# Patient Record
Sex: Female | Born: 1980 | Race: White | Hispanic: No | Marital: Married | Smoking: Never smoker
Health system: Southern US, Community
[De-identification: ages and names within clinical notes are randomized; demographics above are authoritative.]

## PROBLEM LIST (undated history)

## (undated) DIAGNOSIS — F32A Depression, unspecified: Secondary | ICD-10-CM

## (undated) DIAGNOSIS — F329 Major depressive disorder, single episode, unspecified: Secondary | ICD-10-CM

## (undated) DIAGNOSIS — Z8619 Personal history of other infectious and parasitic diseases: Secondary | ICD-10-CM

## (undated) HISTORY — PX: WISDOM TOOTH EXTRACTION: SHX21

## (undated) HISTORY — DX: Personal history of other infectious and parasitic diseases: Z86.19

## (undated) HISTORY — DX: Major depressive disorder, single episode, unspecified: F32.9

## (undated) HISTORY — PX: LAPAROSCOPY: SHX197

## (undated) HISTORY — DX: Depression, unspecified: F32.A

---

## 2005-03-30 ENCOUNTER — Ambulatory Visit: Payer: Self-pay | Admitting: Internal Medicine

## 2015-02-07 ENCOUNTER — Ambulatory Visit: Payer: BLUE CROSS/BLUE SHIELD | Admitting: Podiatry

## 2015-02-21 ENCOUNTER — Encounter: Payer: Self-pay | Admitting: Podiatry

## 2015-02-21 ENCOUNTER — Ambulatory Visit (INDEPENDENT_AMBULATORY_CARE_PROVIDER_SITE_OTHER): Payer: BLUE CROSS/BLUE SHIELD | Admitting: Podiatry

## 2015-02-21 VITALS — BP 115/72 | HR 58 | Resp 16 | Ht 68.0 in | Wt 150.0 lb

## 2015-02-21 DIAGNOSIS — M779 Enthesopathy, unspecified: Secondary | ICD-10-CM | POA: Diagnosis not present

## 2015-02-21 DIAGNOSIS — M2010 Hallux valgus (acquired), unspecified foot: Secondary | ICD-10-CM

## 2015-02-21 NOTE — Progress Notes (Signed)
   Subjective:    Patient ID: Cynthia Sweeney, female    DOB: 1980-09-07, 34 y.o.   MRN: [id number]  HPI Patient presents with needing new orthotics made. Pt's old ones are worn out.    Review of Systems  Neurological: Positive for headaches.  All other systems reviewed and are negative.      Objective:   Physical Exam        Assessment & Plan:

## 2015-02-24 NOTE — Progress Notes (Signed)
Subjective:     Patient ID: Cynthia Sweeney, female   DOB: [birth year], 34 y.o.   MRN: [id number]  HPI patient presents stating she needs new orthotics as her other ones have worn out. States that her feet are relatively flat and has the beginnings of structural bunion deformity   Review of Systems  All other systems reviewed and are negative.      Objective:   Physical Exam  Constitutional: She is oriented to person, place, and time.  Cardiovascular: Intact distal pulses.   Musculoskeletal: Normal range of motion.  Neurological: She is oriented to person, place, and time.  Skin: Skin is warm and dry.  Nursing note and vitals reviewed.  neurovascular status was found to be intact with muscle strength adequate range of motion within normal limits. 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 Scanned for custom orthotics to lift up the arch and take pressure off both feet

## 2015-03-21 ENCOUNTER — Ambulatory Visit: Payer: BLUE CROSS/BLUE SHIELD | Admitting: *Deleted

## 2015-03-21 DIAGNOSIS — M779 Enthesopathy, unspecified: Secondary | ICD-10-CM

## 2015-03-21 NOTE — Patient Instructions (Signed)

## 2015-03-21 NOTE — Progress Notes (Signed)
Patient ID: Cynthia Sweeney, female   DOB: 08/28/1980, 34 y.o.   MRN: [id number] Patient presents for orthotic pick up.  Verbal and written break in and wear instructions given.  Patient will follow up in 4 weeks if symptoms worsen or fail to improve.

## 2015-06-15 NOTE — L&D Delivery Note (Signed)
Delivery Note At 534pm a healthy female was delivered via spontaneous vaginal delivery  (Presentation:vertex ;  ROA).  APGAR:8 ,9 ; weight pending  .   Placenta status:delivered spontaneously .  Cord:  with the following complications:short.  Anesthesia:  epidural Episiotomy:  none Lacerations:  second degree Suture Repair: 2.0 vicryl to reinforce sphincter and 3-0 vicryl rapide Est. Blood Loss (mL):   Mom to postpartum.  Baby to Couplet care / Skin to Skin.  Oliver Pila 01/14/2016, 6:07 PM

## 2015-06-18 LAB — OB RESULTS CONSOLE ANTIBODY SCREEN: Antibody Screen: NEGATIVE

## 2015-06-18 LAB — OB RESULTS CONSOLE GC/CHLAMYDIA
CHLAMYDIA, DNA PROBE: NEGATIVE
Gonorrhea: NEGATIVE

## 2015-06-18 LAB — OB RESULTS CONSOLE RPR: RPR: NONREACTIVE

## 2015-06-18 LAB — OB RESULTS CONSOLE ABO/RH: RH Type: POSITIVE

## 2015-06-18 LAB — OB RESULTS CONSOLE RUBELLA ANTIBODY, IGM: RUBELLA: IMMUNE

## 2015-06-18 LAB — OB RESULTS CONSOLE HEPATITIS B SURFACE ANTIGEN: Hepatitis B Surface Ag: NEGATIVE

## 2015-06-18 LAB — OB RESULTS CONSOLE HIV ANTIBODY (ROUTINE TESTING): HIV: NONREACTIVE

## 2015-12-13 ENCOUNTER — Inpatient Hospital Stay (HOSPITAL_COMMUNITY): Admission: AD | Admit: 2015-12-13 | Payer: Self-pay | Source: Ambulatory Visit | Admitting: Obstetrics and Gynecology

## 2015-12-15 LAB — OB RESULTS CONSOLE GBS: GBS: NEGATIVE

## 2016-01-08 ENCOUNTER — Encounter (HOSPITAL_COMMUNITY): Payer: Self-pay | Admitting: *Deleted

## 2016-01-08 ENCOUNTER — Telehealth (HOSPITAL_COMMUNITY): Payer: Self-pay | Admitting: *Deleted

## 2016-01-08 NOTE — Telephone Encounter (Signed)
Preadmission screen  

## 2016-01-13 NOTE — H&P (Signed)
Cynthia Sweeney is a 35 y.o. female G1P0 at 54 5/7 weeks (EDD 01/09/16 by LMP c/w 10 week [place]) presenting for ripening and IOL post due date.   Prenatal care complicated by AMA, Panorama low risk, female.  She has a history of fibroids with largest one 6-8 cm and they have not really caused a problem this pregnancy.   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    OB History    Gravida Para Term Preterm AB Living   1             SAB TAB Ectopic Multiple Live Births                 Past Medical History:  Diagnosis Date  . Depression    mild  . Hx of varicella    Past Surgical History:  Procedure Laterality Date  . LAPAROSCOPY     ovarian cyst borderline serous, R tube removed  . WISDOM TOOTH EXTRACTION     Family History: family history is not on file. Social History:  reports that she has never smoked. She does not have any smokeless tobacco history on file. Her alcohol and drug histories are not on file.     Maternal Diabetes: No Genetic Screening: Normal Maternal Ultrasounds/Referrals: Normal  Fetal Ultrasounds or other Referrals:  None Maternal Substance Abuse:  No Significant Maternal Medications:  None Significant Maternal Lab Results:  None Other Comments:  None  Review of Systems  Gastrointestinal: Negative for abdominal pain.  Neurological: Negative for headaches.   Maternal Medical History:  Contractions: Frequency: rare.   Perceived severity is mild.    Fetal activity: Perceived fetal activity is normal.    Prenatal complications: Fibroids  Prenatal Complications - Diabetes: none.      Last menstrual period 04/04/2015. Maternal Exam:  Uterine Assessment: Contraction strength is mild.  Contraction frequency is rare.   Abdomen: Patient reports no abdominal tenderness. Surgical scars: low transverse.   Fetal presentation: vertex  Introitus: Normal vulva. Normal vagina.    Physical Exam  Constitutional: She appears  well-developed.  Cardiovascular: Normal rate and regular rhythm.   GI: Soft.  Genitourinary: Vagina normal.  Neurological: She is alert.  Psychiatric: She has a normal mood and affect.    Prenatal labs: ABO, Rh: O/Positive/-- (01/04 0000) Antibody: Negative (01/04 0000) Rubella: Immune (01/04 0000) RPR: Nonreactive (01/04 0000)  HBsAg: Negative (01/04 0000)  HIV: Non-reactive (01/04 0000)  GBS: Negative (07/03 0000)  One hour GCT 97  Assessment/Plan: Pt for IOL at term and plan is cervical ripening with cytotec then AROM and pitocin.     Oliver Pila 01/13/2016, 1:40 PM

## 2016-01-14 ENCOUNTER — Inpatient Hospital Stay (HOSPITAL_COMMUNITY): Payer: 59 | Admitting: Anesthesiology

## 2016-01-14 ENCOUNTER — Inpatient Hospital Stay (HOSPITAL_COMMUNITY)
Admission: RE | Admit: 2016-01-14 | Discharge: 2016-01-16 | DRG: 775 | Disposition: A | Discharge status: Home / Self Care | Payer: 59 | Source: Ambulatory Visit | Attending: Obstetrics and Gynecology | Admitting: Obstetrics and Gynecology

## 2016-01-14 ENCOUNTER — Encounter (HOSPITAL_COMMUNITY): Payer: Self-pay

## 2016-01-14 DIAGNOSIS — Z3A40 40 weeks gestation of pregnancy: Secondary | ICD-10-CM

## 2016-01-14 DIAGNOSIS — Z3490 Encounter for supervision of normal pregnancy, unspecified, unspecified trimester: Secondary | ICD-10-CM

## 2016-01-14 DIAGNOSIS — O480 Post-term pregnancy: Principal | ICD-10-CM | POA: Diagnosis present

## 2016-01-14 LAB — CBC
HCT: 37.2 % (ref 36.0–46.0)
Hemoglobin: 12.6 g/dL (ref 12.0–15.0)
MCH: 32.3 pg (ref 26.0–34.0)
MCHC: 33.9 g/dL (ref 30.0–36.0)
MCV: 95.4 fL (ref 78.0–100.0)
PLATELETS: 176 10*3/uL (ref 150–400)
RBC: 3.9 MIL/uL (ref 3.87–5.11)
RDW: 14.1 % (ref 11.5–15.5)
WBC: 9.8 10*3/uL (ref 4.0–10.5)

## 2016-01-14 LAB — ABO/RH: ABO/RH(D): O POS

## 2016-01-14 LAB — TYPE AND SCREEN
ABO/RH(D): O POS
Antibody Screen: NEGATIVE

## 2016-01-14 MED ORDER — WITCH HAZEL-GLYCERIN EX PADS: 1.0000 "application " | MEDICATED_PAD | CUTANEOUS | Status: DC | PRN

## 2016-01-14 MED ORDER — PHENYLEPHRINE 40 MCG/ML (10ML) SYRINGE FOR IV PUSH (FOR BLOOD PRESSURE SUPPORT)
80.0000 ug | PREFILLED_SYRINGE | INTRAVENOUS | Status: DC | PRN
  Filled 2016-01-14: qty 10
  Filled 2016-01-14: qty 5

## 2016-01-14 MED ORDER — ZOLPIDEM TARTRATE 5 MG PO TABS
5.0000 mg | ORAL_TABLET | Freq: Every evening | ORAL | Status: DC | PRN
  Administered 2016-01-14: 5 mg via ORAL
  Filled 2016-01-14: qty 1

## 2016-01-14 MED ORDER — OXYCODONE HCL 5 MG PO TABS: 5.0000 mg | ORAL_TABLET | ORAL | Status: DC | PRN

## 2016-01-14 MED ORDER — LACTATED RINGERS IV SOLN
INTRAVENOUS | Status: DC
  Administered 2016-01-14 (×2): via INTRAVENOUS

## 2016-01-14 MED ORDER — DIBUCAINE 1 % RE OINT: 1.0000 "application " | TOPICAL_OINTMENT | RECTAL | Status: DC | PRN

## 2016-01-14 MED ORDER — COCONUT OIL OIL
1.0000 "application " | TOPICAL_OIL | Status: DC | PRN
  Administered 2016-01-14: 1 via TOPICAL
  Filled 2016-01-14: qty 120

## 2016-01-14 MED ORDER — SOD CITRATE-CITRIC ACID 500-334 MG/5ML PO SOLN: 30.0000 mL | ORAL | Status: DC | PRN

## 2016-01-14 MED ORDER — LACTATED RINGERS IV SOLN
500.0000 mL | INTRAVENOUS | Status: DC | PRN
  Administered 2016-01-14: 250 mL via INTRAVENOUS

## 2016-01-14 MED ORDER — ONDANSETRON HCL 4 MG/2ML IJ SOLN: 4.0000 mg | INTRAMUSCULAR | Status: DC | PRN

## 2016-01-14 MED ORDER — FLEET ENEMA 7-19 GM/118ML RE ENEM: 1.0000 | ENEMA | RECTAL | Status: DC | PRN

## 2016-01-14 MED ORDER — ONDANSETRON HCL 4 MG PO TABS: 4.0000 mg | ORAL_TABLET | ORAL | Status: DC | PRN

## 2016-01-14 MED ORDER — TERBUTALINE SULFATE 1 MG/ML IJ SOLN
0.2500 mg | Freq: Once | INTRAMUSCULAR | Status: DC | PRN
  Filled 2016-01-14: qty 1

## 2016-01-14 MED ORDER — BENZOCAINE-MENTHOL 20-0.5 % EX AERO
1.0000 "application " | INHALATION_SPRAY | CUTANEOUS | Status: DC | PRN
  Administered 2016-01-14: 1 via TOPICAL
  Filled 2016-01-14: qty 56

## 2016-01-14 MED ORDER — IBUPROFEN 600 MG PO TABS
600.0000 mg | ORAL_TABLET | Freq: Four times a day (QID) | ORAL | Status: DC
  Administered 2016-01-15 – 2016-01-16 (×5): 600 mg via ORAL
  Filled 2016-01-14 (×4): qty 1

## 2016-01-14 MED ORDER — OXYCODONE-ACETAMINOPHEN 5-325 MG PO TABS: 2.0000 | ORAL_TABLET | ORAL | Status: DC | PRN

## 2016-01-14 MED ORDER — ACETAMINOPHEN 325 MG PO TABS: 650.0000 mg | ORAL_TABLET | ORAL | Status: DC | PRN

## 2016-01-14 MED ORDER — PRENATAL MULTIVITAMIN CH
1.0000 | ORAL_TABLET | Freq: Every day | ORAL | Status: DC
  Administered 2016-01-16: 1 via ORAL

## 2016-01-14 MED ORDER — LIDOCAINE HCL (PF) 1 % IJ SOLN
INTRAMUSCULAR | Status: DC | PRN
  Administered 2016-01-14 (×2): 4 mL

## 2016-01-14 MED ORDER — OXYTOCIN 40 UNITS IN LACTATED RINGERS INFUSION - SIMPLE MED: 2.5000 [IU]/h | INTRAVENOUS | Status: DC

## 2016-01-14 MED ORDER — ONDANSETRON HCL 4 MG/2ML IJ SOLN
4.0000 mg | Freq: Four times a day (QID) | INTRAMUSCULAR | Status: DC | PRN
  Administered 2016-01-14: 4 mg via INTRAVENOUS
  Filled 2016-01-14: qty 2

## 2016-01-14 MED ORDER — OXYCODONE-ACETAMINOPHEN 5-325 MG PO TABS
1.0000 | ORAL_TABLET | ORAL | Status: DC | PRN
Start: 2016-01-14 — End: 2016-01-14

## 2016-01-14 MED ORDER — EPHEDRINE 5 MG/ML INJ
10.0000 mg | INTRAVENOUS | Status: DC | PRN
  Filled 2016-01-14: qty 4

## 2016-01-14 MED ORDER — ACETAMINOPHEN 325 MG PO TABS
650.0000 mg | ORAL_TABLET | ORAL | Status: DC | PRN
  Administered 2016-01-16: 650 mg via ORAL
  Filled 2016-01-14: qty 2

## 2016-01-14 MED ORDER — LIDOCAINE HCL (PF) 1 % IJ SOLN
30.0000 mL | INTRAMUSCULAR | Status: DC | PRN
  Filled 2016-01-14: qty 30

## 2016-01-14 MED ORDER — LACTATED RINGERS IV SOLN
500.0000 mL | Freq: Once | INTRAVENOUS | Status: AC
  Administered 2016-01-14: 500 mL via INTRAVENOUS

## 2016-01-14 MED ORDER — SENNOSIDES-DOCUSATE SODIUM 8.6-50 MG PO TABS
2.0000 | ORAL_TABLET | ORAL | Status: DC
  Administered 2016-01-15 (×2): 2 via ORAL
  Filled 2016-01-14: qty 2

## 2016-01-14 MED ORDER — ZOLPIDEM TARTRATE 5 MG PO TABS: 5.0000 mg | ORAL_TABLET | Freq: Every evening | ORAL | Status: DC | PRN

## 2016-01-14 MED ORDER — MISOPROSTOL 25 MCG QUARTER TABLET
25.0000 ug | ORAL_TABLET | ORAL | Status: DC | PRN
  Administered 2016-01-14 (×2): 25 ug via VAGINAL
  Filled 2016-01-14: qty 1
  Filled 2016-01-14 (×2): qty 0.25

## 2016-01-14 MED ORDER — OXYTOCIN BOLUS FROM INFUSION
500.0000 mL | Freq: Once | INTRAVENOUS | Status: AC
  Administered 2016-01-14: 500 mL via INTRAVENOUS

## 2016-01-14 MED ORDER — DIPHENHYDRAMINE HCL 50 MG/ML IJ SOLN: 12.5000 mg | INTRAMUSCULAR | Status: DC | PRN

## 2016-01-14 MED ORDER — SIMETHICONE 80 MG PO CHEW
80.0000 mg | CHEWABLE_TABLET | ORAL | Status: DC | PRN
Start: 2016-01-14 — End: 2016-01-16

## 2016-01-14 MED ORDER — OXYCODONE HCL 5 MG PO TABS: 10.0000 mg | ORAL_TABLET | ORAL | Status: DC | PRN

## 2016-01-14 MED ORDER — PHENYLEPHRINE 40 MCG/ML (10ML) SYRINGE FOR IV PUSH (FOR BLOOD PRESSURE SUPPORT)
80.0000 ug | PREFILLED_SYRINGE | INTRAVENOUS | Status: DC | PRN
  Filled 2016-01-14: qty 5

## 2016-01-14 MED ORDER — DIPHENHYDRAMINE HCL 25 MG PO CAPS: 25.0000 mg | ORAL_CAPSULE | Freq: Four times a day (QID) | ORAL | Status: DC | PRN

## 2016-01-14 MED ORDER — OXYTOCIN 40 UNITS IN LACTATED RINGERS INFUSION - SIMPLE MED
1.0000 m[IU]/min | INTRAVENOUS | Status: DC
  Administered 2016-01-14: 2 m[IU]/min via INTRAVENOUS
  Filled 2016-01-14: qty 1000

## 2016-01-14 MED ORDER — FENTANYL 2.5 MCG/ML BUPIVACAINE 1/10 % EPIDURAL INFUSION (WH - ANES)
14.0000 mL/h | INTRAMUSCULAR | Status: DC | PRN
  Administered 2016-01-14 (×2): 14 mL/h via EPIDURAL
  Filled 2016-01-14: qty 125

## 2016-01-14 NOTE — Anesthesia Preprocedure Evaluation (Signed)
Anesthesia Evaluation  Patient identified by MRN, date of birth, ID band Patient awake    Reviewed: Allergy & Precautions, NPO status , Patient's Chart, lab work & pertinent test results  History of Anesthesia Complications Negative for: history of anesthetic complications  Airway Mallampati: II  TM Distance: >3 FB Neck ROM: Full    Dental no notable dental hx. (+) Dental Advisory Given   Pulmonary neg pulmonary ROS,    Pulmonary exam normal breath sounds clear to auscultation       Cardiovascular negative cardio ROS Normal cardiovascular exam Rhythm:Regular Rate:Normal     Neuro/Psych negative neurological ROS  negative psych ROS   GI/Hepatic negative GI ROS, Neg liver ROS,   Endo/Other  negative endocrine ROS  Renal/GU negative Renal ROS  negative genitourinary   Musculoskeletal negative musculoskeletal ROS (+)   Abdominal   Peds negative pediatric ROS (+)  Hematology negative hematology ROS (+)   Anesthesia Other Findings   Reproductive/Obstetrics (+) Pregnancy                             Anesthesia Physical Anesthesia Plan  ASA: II  Anesthesia Plan: Epidural   Post-op Pain Management:    Induction:   Airway Management Planned:   Additional Equipment:   Intra-op Plan:   Post-operative Plan:   Informed Consent: I have reviewed the patients History and Physical, chart, labs and discussed the procedure including the risks, benefits and alternatives for the proposed anesthesia with the patient or authorized representative who has indicated his/her understanding and acceptance.   Dental advisory given  Plan Discussed with: CRNA  Anesthesia Plan Comments:         Anesthesia Quick Evaluation  

## 2016-01-14 NOTE — Anesthesia Pain Management Evaluation Note (Signed)
  CRNA Pain Management Visit Note  Patient: Cynthia Sweeney, 35 y.o., female  "Hello I am a member of the anesthesia team at [facility]. We have an anesthesia team available at all times to provide care throughout the hospital, including epidural management and anesthesia for C-section. I don't know your plan for the delivery whether it a natural birth, water birth, IV sedation, nitrous supplementation, doula or epidural, but we want to meet your pain goals."   1.Was your pain managed to your expectations on prior hospitalizations?   Yes   2.What is your expectation for pain management during this hospitalization?     Epidural  3.How can we help you reach that goal? unsure  Record the patient's initial score and the patient's pain goal.   Pain: 1  Pain Goal: 7 The [facility] wants you to be able to say your pain was always managed very well.  Cephus Shelling 01/14/2016

## 2016-01-14 NOTE — Plan of Care (Signed)
Problem: Coping: Goal: Ability to cope will improve Outcome: Not Progressing Hx: depression  Problem: Urinary Elimination: Goal: Ability to reestablish a normal urinary elimination pattern will improve Outcome: Progressing Due to void

## 2016-01-14 NOTE — Anesthesia Procedure Notes (Addendum)
Epidural Patient location during procedure: OB  Staffing Anesthesiologist: Brenden Rudman, Rashawnda Performed: anesthesiologist   Preanesthetic Checklist Completed: patient identified, site marked, surgical consent, pre-op evaluation, timeout performed, IV checked, risks and benefits discussed and monitors and equipment checked  Epidural Patient position: sitting Prep: site prepped and draped and DuraPrep Patient monitoring: continuous pulse ox and blood pressure Approach: midline Location: L3-L4 Injection technique: LOR saline  Needle:  Needle type: Tuohy  Needle gauge: 17 G Needle length: 9 cm and 9 Needle insertion depth: 5 cm cm Catheter type: closed end flexible Catheter size: 19 Gauge Catheter at skin depth: 10 cm Test dose: negative  Assessment Events: blood not aspirated, injection not painful, no injection resistance, negative IV test and no paresthesia  Additional Notes Patient identified. Risks/Benefits/Options discussed with patient including but not limited to bleeding, infection, nerve damage, paralysis, failed block, incomplete pain control, headache, blood pressure changes, nausea, vomiting, reactions to medication both or allergic, itching and postpartum back pain. Confirmed with bedside nurse the patient's most recent platelet count. Confirmed with patient that they are not currently taking any anticoagulation, have any bleeding history or any family history of bleeding disorders. Patient expressed understanding and wished to proceed. All questions were answered. Sterile technique was used throughout the entire procedure. Please see nursing notes for vital signs. Test dose was given through epidural catheter and negative prior to continuing to dose epidural or start infusion. Warning signs of high block given to the patient including shortness of breath, tingling/numbness in hands, complete motor block, or any concerning symptoms with instructions to call for help. Patient was  given instructions on fall risk and not to get out of bed. All questions and concerns addressed with instructions to call with any issues or inadequate analgesia.        

## 2016-01-14 NOTE — Progress Notes (Signed)
Patient ID: Cynthia Sweeney, female   DOB: [birth year], 35 y.o.   MRN: [id number] Pt admitted and received 2 doses of cytotec overnight with mild cramping only  afeb vss FHR Category 1 Cervix 50/1-2/-2 midposition  AROM clear  Will start pitocin 4 hours after last cytotec.

## 2016-01-14 NOTE — Progress Notes (Signed)
Patient ID: Cynthia Sweeney, female   DOB: 09-Jan-1981, 35 y.o.   MRN: [id number] Pt getting increasingly uncomfortable, more back pain  afeb vss FHR Category 1  Cervix 75/2-3/-1  Getting uncomfortable, considering epidural

## 2016-01-15 LAB — CBC
HCT: 32.9 % — ABNORMAL LOW (ref 36.0–46.0)
Hemoglobin: 11 g/dL — ABNORMAL LOW (ref 12.0–15.0)
MCH: 31.5 pg (ref 26.0–34.0)
MCHC: 33.4 g/dL (ref 30.0–36.0)
MCV: 94.3 fL (ref 78.0–100.0)
PLATELETS: 152 10*3/uL (ref 150–400)
RBC: 3.49 MIL/uL — ABNORMAL LOW (ref 3.87–5.11)
RDW: 14.2 % (ref 11.5–15.5)
WBC: 17.8 10*3/uL — ABNORMAL HIGH (ref 4.0–10.5)

## 2016-01-15 LAB — RPR: RPR Ser Ql: NONREACTIVE

## 2016-01-15 NOTE — Progress Notes (Signed)
MOB was referred for history of depression/anxiety. * Referral screened out by Clinical Social Worker because none of the following criteria appear to apply: ~ History of anxiety/depression during this pregnancy, or of post-partum depression. ~ Diagnosis of anxiety and/or depression within last 3 years OR * MOB's symptoms currently being treated with medication and/or therapy. Please contact the Clinical Social Worker if needs arise, or if MOB requests.  CSW reviewed MOB's chart and no documentation of MH concerns during pregnancy.    Blaine Hamper, MSW, LCSW Clinical Social Work [phone]

## 2016-01-15 NOTE — Lactation Note (Signed)
This note was copied from a baby's chart. Lactation Consultation Note  Patient Name: Cynthia Sweeney [id number] Date: 01/15/2016 Reason for consult: Follow-up assessment   Follow up with mom to set up DEBP. Taught mom and dad how to set up, assemble, disassemble and clean pump parts. Advised mom to pump if not able to put infant to breast and can pump post BF if wants to encourage milk to come in. All EBM obtained from pumping/hand expressing should be fed to infant. Enc mom to call out for next feeding to be shown how to spoon feed or assist with BF. Mom reports nipples are burning/hurting with comfort gels, removed comfort gels and had mom place breast shells. Told mom she can use breast shells between feeds during the day and not to sleep in, mom voiced understanding. Mom to call out to desk for next feeding to be shown how to spoon feed or for feeding assistance. Follow up tomorrow and prn. Report and POC to [place], Charity fundraiser.    Maternal Data Formula Feeding for Exclusion: No Has patient been taught Hand Expression?: Yes Does the patient have breastfeeding experience prior to this delivery?: No  Feeding Feeding Type: Breast Fed Length of feed: 25 min  LATCH Score/Interventions Latch: Grasps breast easily, tongue down, lips flanged, rhythmical sucking. Intervention(s): Skin to skin;Teach feeding cues;Waking techniques Intervention(s): Adjust position;Assist with latch;Breast massage;Breast compression  Audible Swallowing: A few with stimulation Intervention(s): Hand expression;Skin to skin  Type of Nipple: Everted at rest and after stimulation  Comfort (Breast/Nipple): Engorged, cracked, bleeding, large blisters, severe discomfort Problem noted: Cracked, bleeding, blisters, bruises Intervention(s): Expressed breast milk to nipple  Interventions (Mild/moderate discomfort): Comfort gels;Hand expression  Hold (Positioning): Assistance needed to correctly position infant at breast and maintain  latch. Intervention(s): Breastfeeding basics reviewed;Support Pillows;Position options;Skin to skin  LATCH Score: 6  Lactation Tools Discussed/Used Tools: Pump;Shells Nipple shield size: 24 Shell Type: Inverted Breast pump type: Double-Electric Breast Pump WIC Program: No Pump Review: Setup, frequency, and cleaning;Milk Storage Initiated by:: Noralee Stain, RN IBCLC Date initiated:: 01/15/16   Consult Status Consult Status: Follow-up Date: 01/16/16 Follow-up type: In-patient    Cynthia Sweeney 01/15/2016, 3:45 PM

## 2016-01-15 NOTE — Progress Notes (Signed)
PPD #1 No problems Afeb, VSS Fundus firm, NT at U-1 Continue routine postpartum care 

## 2016-01-15 NOTE — Lactation Note (Signed)
This note was copied from a baby's chart. Lactation Consultation Note  Patient Name: Cynthia Sweeney Date: 01/15/2016 Reason for consult: Initial assessment;Breast/nipple pain   Initial consult with first time mom of 5 hour old infant. Infant with 4 BF for 20-25 minutes, 2 attempts, 2 voids and 4 stools since birth. LATCH score of 4 by bedside RN. Infant weight 8 lb 1.3 oz with 0% weight loss since birth.  Mom reports that it is very painful to nurse. Mom's nipples are excoriated and scant bleeding noted to breast pads. Mom reports she has been watched with feedings and it has been reported to her that the baby is latching well. She reports MD did not see a problem with baby's mouth on exam.  Infant was asleep in dad's arms, she aroused easily. Infant's mouth examined, she is noted to have a short labial frenulum that extends almost to the bottom of the upper gum, her upper lip flanges well. She has good tongue mobility and is noted to extend tongue freely. While sucking on gloved finger she is noted to extend tongue and cup finger. She is noted to have a semi-high palate.   Mom latched infant to left breast in football hold with good positioning and pillow support. Infant latched easily. She did need assistance with flanging upper and lower lips, showed mom and dad how to flange upper lips chin tug lower lip to widen gape and enc them to do so with each feeding. She was noted to have rhythmic sucking and swallowing. Mom reported severe pain/pinching with feeding. Showed mom how to break suction to unlatch infant. Nipple was noted to be round when infant unlatched.  Placed # 24 NS placed and infant re latched to left breast. Mom noted pain improved slightly with NS. Infant nursed for about 15 minutes and then infant was removed from breast. Small amount Colostrum was noted in NS. Mom fed infant colostrum on finger. Infant was then latched to right breast in cross cradle hold with # 24 NS. Mom  reports pain did not diminish in this position, nipple was compressed when infant taken off the breast. We then placed infant in football hold to right breast and again latched her to the breast where she fed for about 10 minutes and then fell asleep. Infant was removed from breast and colostrum was noted in NS, mom fed infant colostrum on finger. Infant was then held by dad and was in a deep sleep.   Mom was able to apply NS with feeding. Showed mom how to hand express and received 1 large gtt colostrum from each side. Mom was able to return demonstration. Enc mom to use Expressed colostrum to nipples post BF and then to apply Comfort Gels to nipples between feeds. Mom has been using coconut oil. Advised not to use coconut oil with Comfort Gels. Instructions for Comfort Gel use and cleaning reviewed. Discussed with parents that infant can be spoon fed colostrum once able to collect in spoon.   Discussed with mom pumping if she is not able to tolerate infant at the breast. Will return to set up DEBP once mom has lunch. Mom requesting assistance with next feeding.        Maternal Data Formula Feeding for Exclusion: No Has patient been taught Hand Expression?: Yes Does the patient have breastfeeding experience prior to this delivery?: No  Feeding Feeding Type: Breast Fed Length of feed: 25 min  LATCH Score/Interventions Latch: Grasps breast easily, tongue down,  lips flanged, rhythmical sucking. Intervention(s): Skin to skin;Teach feeding cues;Waking techniques Intervention(s): Adjust position;Assist with latch;Breast massage;Breast compression  Audible Swallowing: A few with stimulation Intervention(s): Hand expression;Skin to skin  Type of Nipple: Everted at rest and after stimulation  Comfort (Breast/Nipple): Engorged, cracked, bleeding, large blisters, severe discomfort Problem noted: Cracked, bleeding, blisters, bruises Intervention(s): Expressed breast milk to  nipple  Interventions (Mild/moderate discomfort): Comfort gels;Hand expression  Hold (Positioning): Assistance needed to correctly position infant at breast and maintain latch. Intervention(s): Breastfeeding basics reviewed;Support Pillows;Position options;Skin to skin  LATCH Score: 6  Lactation Tools Discussed/Used Tools: Nipple Shields;Comfort gels Nipple shield size: 24 WIC Program: No   Consult Status Consult Status: Follow-up Date: 01/15/16 Follow-up type: In-patient    Silas Flood Bana Borgmeyer 01/15/2016, 2:33 PM

## 2016-01-15 NOTE — Lactation Note (Signed)
This note was copied from a baby's chart. Lactation Consultation Note  Patient Name: Cynthia Sweeney [id number] Date: 01/15/2016 Reason for consult: Follow-up assessment;Breast/nipple pain  Mom called out for feeding assessment.  She just finished feeding on left breast and states it still pinched some but much better with nipple shield.  Nipple rounded after feeding.  Assisted with football hold on right side. Baby latched easily and latch appears good.  Observed active nursing with intermittent swallows.  Mom knows she can rest her nipples and pump in place if needed.  Wearing shells for comfort between feedings.  Encouraged to call for assist/concerns prn.   Maternal Data    Feeding Feeding Type: Breast Fed  LATCH Score/Interventions Latch: Grasps breast easily, tongue down, lips flanged, rhythmical sucking. Intervention(s): Skin to skin;Teach feeding cues;Waking techniques Intervention(s): Adjust position;Assist with latch;Breast massage;Breast compression  Audible Swallowing: A few with stimulation  Type of Nipple: Everted at rest and after stimulation  Comfort (Breast/Nipple): Filling, red/small blisters or bruises, mild/mod discomfort Problem noted: Cracked, bleeding, blisters, bruises Intervention(s): Expressed breast milk to nipple     Hold (Positioning): Assistance needed to correctly position infant at breast and maintain latch. Intervention(s): Breastfeeding basics reviewed;Support Pillows;Position options;Skin to skin  LATCH Score: 7  Lactation Tools Discussed/Used Tools: Nipple Shields Nipple shield size: 24 Shell Type: Inverted Breast pump type: Double-Electric Breast Pump Pump Review: Setup, frequency, and cleaning;Milk Storage Initiated by:: Noralee Stain, RN IBCLC Date initiated:: 01/15/16   Consult Status Consult Status: Follow-up Date: 01/16/16 Follow-up type: In-patient    Huston Foley 01/15/2016, 6:35 PM

## 2016-01-15 NOTE — Anesthesia Postprocedure Evaluation (Signed)
Anesthesia Post Note  Patient: Cynthia Sweeney  Procedure(s) Performed: * No procedures listed *  Patient location during evaluation: Mother Baby Anesthesia Type: Epidural Level of consciousness: awake Pain management: satisfactory to patient Vital Signs Assessment: post-procedure vital signs reviewed and stable Respiratory status: spontaneous breathing Cardiovascular status: stable Anesthetic complications: no     Last Vitals:  Vitals:   01/14/16 2100 01/15/16 0635  BP: 101/72 106/68  Pulse: 62 (!) 57  Resp: 18 18  Temp: 36.7 C 37.1 C    Last Pain:  Vitals:   01/15/16 0641  TempSrc:   PainSc: Asleep   Pain Goal: Patients Stated Pain Goal: 0 (01/14/16 2015)               Cephus Shelling

## 2016-01-16 MED ORDER — IBUPROFEN 600 MG PO TABS: 600.0000 mg | ORAL_TABLET | Freq: Four times a day (QID) | ORAL | 1 refills | Status: DC | PRN

## 2016-01-16 NOTE — Progress Notes (Signed)
Patient ID: Cynthia Sweeney, female   DOB: 1980/07/09, 35 y.o.   MRN: [id number] Pt doing well. Bonding with baby  - having some trouble with breastfeeding - sore nipples. Has tried shields; pumping now. Lochia moderate. No fever or chills. Ambulating and tolerating diet. Ready for discharge to home today VSS ABD- FF, below umb EXT - no Homans  A/P: PPD #2 s/p svd         Routine pp care         D/C instructions reviewed         Rx for ibuprofen         F/u in 6weeks

## 2016-01-16 NOTE — Lactation Note (Signed)
This note was copied from a baby's chart. Lactation Consultation Note  Patient Name: Cynthia Sweeney [id number]'T Date: 01/16/2016  Follow up visit made.  Mom states they had a rough night of cluster feeding and baby fussiness.  Baby was awake for several hours and now too sleepy to feed.  Mom is hand expressing one drop.  Assisted with symphony pump.  Mom will pump x15 minutes and watch baby for feeding cues.  Mom pumped 5 mls and I demonstrated finger feeding with curved tip syringe.  Baby took expressed milk and content after.  Mom is able to tolerate feedings when using a 24 mm nipple shield.  Nipples continue to look red and cracked but no worse than yesterday.  Mom using expressed breast milk to nipples after feeds/shells.  She did not like the comfort gels.  Instructed mom to feed with any feeding cue and to post pump if baby doesn't feed well plus 4 times in 24 hours to protect milk supply.  Reviewed engorgement treatment.  Outpatient lactation appointment scheduled for 01/21/16 1030.  To call if concerns prior to appointment.   Maternal Data    Feeding    LATCH Score/Interventions                      Lactation Tools Discussed/Used     Consult Status      Cynthia Sweeney 01/16/2016, 9:47 AM

## 2016-01-16 NOTE — Discharge Instructions (Signed)
Nothing in vagina for 6 weeks.  No sex, tampons, and douching.  Other instructions as in [facility] Discharge Booklet. °

## 2016-01-17 ENCOUNTER — Telehealth (HOSPITAL_COMMUNITY): Payer: Self-pay | Admitting: Lactation Services

## 2016-01-17 NOTE — Telephone Encounter (Signed)
Mom called, c/o engorgement. Engorgement advice given (cold; IB; cabbage leaves TID x 20 min, etc.). Mom is using a nipple shield & does see milk in the nipple shield when infant releases latch. Mom to call if no improvement. Glenetta Hew, RN, IBCLC

## 2016-01-21 ENCOUNTER — Ambulatory Visit (HOSPITAL_COMMUNITY)
Admission: RE | Admit: 2016-01-21 | Discharge: 2016-01-21 | Disposition: A | Discharge status: Home / Self Care | Payer: 59 | Source: Ambulatory Visit | Attending: Obstetrics and Gynecology | Admitting: Obstetrics and Gynecology

## 2016-01-21 NOTE — Lactation Note (Signed)
Lactation Consult  Mother's reason for visit:  Sore nipples, concerns about breastfeeding Visit Type:  Outpatient Appointment Notes:  Baby has not stooled in almost 24 hours. Approx 5 voids in 24 hours.  Mother has been able to pump approx 25 ml with DEBP.  Mother has been using #24NS due to sore nipples.  Mother has been engorged for approx 3 days which is now resolved. Mother has only been pumping once a day or less sometimes.  Discussed the importance of pumping if using the NS since it is a barrier.  Suggested latching without NS.  Baby latched on both sides without NS easily with minimal discomfort. Mother finds the cluster feeding very exhausting and has started supplementing w/ formula during the night.  Provided strategies for helping with cluster feeding - suggest side lying and giving pumped breastmilk at night to baby after breastfeeding.  Recommend she rotate positions for breastfeeding.  Did note tight labial frenulum but mother does a good job of flanging upper lip when latched.  Taught mother how to increase depth of latch.   Plan is to increase milk volume.  Hopefully mother can continue to breastfeed without NS.   Mother is wearing shells during the day for sore nipples w/ coconut oil and gels.  Provided another set of comfort gels. Recommend applying ebm and be sure to maintain deep latch. Suggest doing power pumping session today and starting fenugreek.  If using NS post pump 3-5 times a day and give baby back volume pumped.  If not using NS mother should post pump 2 times a day and give baby back volume pumped until back to birth weight. Baby transferred approx 34 ounces after breastfeeding on both breasts. Supplement w/ formula if no stool within the next 24 hours.  Consult:  Initial Lactation Consultant:  Hardie Pulley  ________________________________________________________________________ Joan Flores Name:  Dinah Beers Sutley Date of Birth:  01/14/2017 Pediatrician:   Eddie Candle Gender:  female Gestational Age: [redacted]w[redacted]d (At Birth) Birth Weight:  8 lb 1.8 oz (3680 g) Weight at Discharge:  Weight: 7 lb 10.2 oz (3465 g)                                 Date of Discharge:  01/16/2016      Filed Weights   01/14/16 1734 01/14/16 2320 01/16/16 0117  Weight: 8 lb 1.8 oz (3680 g) 8 lb 1.3 oz (3665 g) 7 lb 10.2 oz (3465 g)  [facility]:  7 lb 12 oz     Location:Pediatrician's office Weight today:  7 lb 11.1 ounces    ________________________________________________________________________  Mother's Name: Verlin Grills Guillermo Type of delivery:   Breastfeeding Experience:  primip Maternal Medications:  PNV  ________________________________________________________________________  Breastfeeding History (Post Discharge)  Frequency of breastfeeding:  8-12 times per day Duration of feeding:  15-45 min  Supplementation  Formula:  Volume 30-45 ml Frequency:  Once per day Total volume per day:  45ml       Breastmilk:  Volume 30ml Frequency:  1 Total volume per day:  30ml  Method:  Bottle,   Pumping  Type of pump:  Spectre Frequency:  Less than once per day Volume:  12-25 ml    Infant Intake and Output Assessment  Voids:  5 in 24 hrs.  Color:  Clear yellow Stools:  1 in 24 hrs.  Color:  Manson Passey  ________________________________________________________________________  Maternal Breast Assessment  Breast:  Filling Nipple:  Erect Pain level:  3 Pain interventions:  Comfort gels and Expressed breast milk  _______________________________________________________________________ Feeding Assessment/Evaluation  Initial feeding assessment:  Infant's oral assessment:  Variance  Positioning:  Cross cradle Left breast  LATCH documentation:  Latch:  2 = Grasps breast easily, tongue down, lips flanged, rhythmical sucking.  Audible swallowing:  2 = Spontaneous and intermittent  Type of nipple:  2 = Everted at  rest and after stimulation  Comfort (Breast/Nipple):  1 = Filling, red/small blisters or bruises, mild/mod discomfort  Hold (Positioning):  1 = Assistance needed to correctly position infant at breast and maintain latch  LATCH score:  8  Attached assessment:  Shallow  Lips flanged:  Yes.    Lips untucked:  Yes.    Suck assessment:  Displays both  Tools:  Nipple shield 24 mm Instructed on use and cleaning of tool:  Yes.    Pre-feed weight:  3490 g  Post-feed weight:  3524 g  Amount transferred:  34 ml Amount supplemented:  0ml  No volume transferred on second breast.  Total amount transferred:  34 ml Total supplement given:  0 ml

## 2016-01-28 ENCOUNTER — Ambulatory Visit (HOSPITAL_COMMUNITY)
Admission: RE | Admit: 2016-01-28 | Discharge: 2016-01-28 | Disposition: A | Discharge status: Home / Self Care | Payer: 59 | Source: Ambulatory Visit | Attending: Obstetrics and Gynecology | Admitting: Obstetrics and Gynecology

## 2016-01-28 NOTE — Lactation Note (Signed)
Lactation Consult   Weight today 8- 0 oz 3628 g  Cynthia has been slow to gain weight. Mom has been bottle feeding EBM or formula after nursing since Monday. Good weight gain since. Mom tired and concerned that dad will be going back to work next week and she will be alone to manage all of this. Asking about pumping and bottle feeding EBM. Reviewed pros and cons with her. Tried feeding tube and syringe to supplement while Cynthia was at the breast but she didn't do much better with it. Dad bottle fed EBM after nursing. To see Ped in 2 Tige Meas Suggested getting weight check again next week to make sure she is still gaining well. Reviewed BFSG as an option. Encouragement given. Offered another OP appointment but mom wants to call if needed, No further questions at present. To call prn   Baby's Name:  Cynthia Sweeney Date of Birth:  01/14/2017 Pediatrician:  Eddie Candleummings Gender:  female Gestational Age: 2990w5d (At Birth) Birth Weight:  8 lb 1.8 oz (3680 g) Weight at Discharge:  Weight: 7 lb 10.2 oz (3465 g)                                 Date of Discharge:  01/16/2016      [facility]   01/14/16 1734 01/14/16 2320 01/16/16 0117  Weight: 8 lb 1.8 oz (3680 g) 8 lb 1.3 oz (3665 g) 7 lb 10.2 oz (3465 g)    Mother's reason for visit:  Still losing weight Visit Type:  Feeding assessment  Consult:  Follow-Up Lactation Consultant:  Audry RilesWeeks, Glenn Christo D  ________________________________________________________________________    ________________________________________________________________________  Mother's Name: Cynthia Sweeney Type of delivery:   Breastfeeding Experience:  P1  ________________________________________________________________________  Breastfeeding History (Post Discharge)  Frequency of breastfeeding:  q 2-3 hours Duration of feeding:  30 +  Supplementation  Formula:  Volume  60 ml Total during the day      Breastmilk:  Volume   30-60 ml Frequency:  As available   Method:  Bottle,    Pumping  Type of pump:  Spectra Frequency:  6- 8 times/day Volume:  30-60 ml    Infant Intake and Output Assessment  Voids:  8+ in 24 hrs.  Color:  Clear yellow Stools:  8+ in 24 hrs.  Color:  Brown and Yellow  ________________________________________________________________________  Maternal Breast Assessment  Breast:  Soft Nipple:  Erect and Reddened _______________________________________________________________________ Feeding Assessment/Evaluation  Initial feeding assessment:   Positioning:  Cross cradle Right breast  LATCH documentation:  Latch:  1 = Repeated attempts needed to sustain latch, nipple held in mouth throughout feeding, stimulation needed to elicit sucking reflex.  Audible swallowing:  1 = A few with stimulation  Type of nipple:  2 = Everted at rest and after stimulation  Comfort (Breast/Nipple):  1 = Filling, red/small blisters or bruises, mild/mod discomfort  Hold (Positioning):  1 = Assistance needed to correctly position infant at breast and maintain latch  LATCH score:  8  Attached assessment:  Deep  Lips flanged:  Yes.    Lips untucked:  Yes.    Suck assessment:  Nutritive and Nonnutritive   Pre-feed weight:  3628 g 8-0 oz Post-feed weight:  3652 g 8-0.8 oz Amount transferred:  24 ml  Cynthia latched well after a few attempts. Mom reports nipples are feeling better the last few days. Reports she used a NS the  first few days but has been not been using it recently. Baby going off to sleep after the first few minutes. Mostly non nutritive   Pre-feed weight:  3652 g 8- 0.8 oz Post-feed weight:  3666 g 8- 1.3 oz  Amount transferred:  14 ml Amount supplemented:  8 ml with feeding tube/syringe at the breast, 3 oz EBM by bottle  Latched to other breast, mostly non nutritive used feeding tube/syringe but continues sleepy. Supplementing with bottle after nursing  Total amount pumped post feed:  1 oz total- encouraged mom to use larger  flange

## 2016-02-05 NOTE — Discharge Summary (Signed)
OB Discharge Summary     Patient Name: Cynthia Sweeney DOB: [birth year] MRN: [id number]  Date of admission: 01/14/2016 Delivering MD: Huel Cote   Date of discharge: 02/05/2016  Admitting diagnosis: INDUCTION Intrauterine pregnancy: [redacted]w[redacted]d     Secondary diagnosis:  Active Problems:   Pregnancy   NSVD (normal spontaneous vaginal delivery)  Additional problems: none     Discharge diagnosis: Term Pregnancy Delivered                                                                                                Post partum procedures:none  Augmentation: AROM, Pitocin and Cytotec  Complications: None  Hospital course:  Induction of Labor With Vaginal Delivery   35 y.o. yo G1P1001 at [redacted]w[redacted]d was admitted to the hospital 01/14/2016 for induction of labor.  Indication for induction: Favorable cervix at term.  Patient had an uncomplicated labor course as follows: Membrane Rupture Time/Date: 8:35 AM ,01/14/2016   Intrapartum Procedures: Episiotomy: None [1]                                         Lacerations:  2nd degree [3];Perineal [11]  Patient had delivery of a Viable infant.  Information for the patient's newborn:  Ashya, Nicolaisen [[id number]]  Delivery Method: Vaginal, Spontaneous Delivery (Filed from Delivery Summary)   01/14/2016  Details of delivery can be found in separate delivery note.  Patient had a routine postpartum course. Patient is discharged home 02/05/16.   Physical exam Vitals:   01/14/16 2100 01/15/16 0635 01/15/16 1855 01/16/16 0615  BP: 101/72 106/68 121/87 102/67  Pulse: 62 (!) 57 67 (!) 55  Resp: 18 18 18 18   Temp: 98.1 F (36.7 C) 98.7 F (37.1 C) 97.7 F (36.5 C) 97.8 F (36.6 C)  TempSrc: Oral Oral Oral Oral  SpO2:      Weight:      Height:       General: alert, cooperative and no distress Lochia: appropriate Uterine Fundus: firm Incision: N/A DVT Evaluation: No evidence of DVT seen on physical exam. No significant calf/ankle  edema. Labs: Lab Results  Component Value Date   WBC 17.8 (H) 01/15/2016   HGB 11.0 (L) 01/15/2016   HCT 32.9 (L) 01/15/2016   MCV 94.3 01/15/2016   PLT 152 01/15/2016   No flowsheet data found.  Discharge instruction: per After Visit Summary and "Baby and Me Booklet".  After visit meds:    Medication List    TAKE these medications   ibuprofen 600 MG tablet Commonly known as:  ADVIL,MOTRIN Take 1 tablet (600 mg total) by mouth every 6 (six) hours as needed for fever, headache, mild pain, moderate pain or cramping.   prenatal multivitamin Tabs tablet Take 1 tablet by mouth daily at 12 noon.       Diet: routine diet  Activity: Advance as tolerated. Pelvic rest for 6 weeks.   Outpatient follow up:6 weeks Follow up Appt:Future Appointments Date Time Provider Department Center  02/06/2016 10:30 AM  [facility] CONSULT[facility] None   Follow up Visit:No Follow-up on file.  Postpartum contraception: Undecided  Newborn Data: Live born female  Birth Weight: 8 lb 1.8 oz (3680 g) APGAR: 9, 9  Baby Feeding: Breast Disposition:home with mother   02/05/2016 [facility]ore[facility] HopeBanga, DO

## 2016-02-06 ENCOUNTER — Ambulatory Visit (HOSPITAL_COMMUNITY)
Admission: RE | Admit: 2016-02-06 | Discharge: 2016-02-06 | Disposition: A | Discharge status: Home / Self Care | Payer: 59 | Source: Ambulatory Visit | Attending: Obstetrics and Gynecology | Admitting: Obstetrics and Gynecology

## 2016-02-06 ENCOUNTER — Ambulatory Visit (HOSPITAL_COMMUNITY): Admission: RE | Admit: 2016-02-06 | Payer: 59 | Source: Ambulatory Visit

## 2016-02-06 NOTE — Lactation Note (Signed)
Lactation Consult  Mother's reason for visit:  Weight check Visit Type:  Outpatient Appointment Notes:  See below Consult:  Follow-Up Lactation Consultant:  Judee ClaraSmith, Carola Viramontes E  ________________________________________________________________________  Joan FloresBaby's Name:  Dinah Beershloe Eliana Detweiler Date of Birth:  01/14/2017 Pediatrician:  Michiel SitesMark Cummings Gender:  female Gestational Age: 8732w5d (At Birth) Birth Weight:  8 lb 1.8 oz (3680 g) Weight at Discharge:  Weight: 7 lb 10.2 oz (3465 g)                                 Date of Discharge:  01/16/2016      [facility]   01[facility]16 1734 01/14/16 2320 01/16/16 0117  Weight: 8 lb 1.8 oz (3680 g) 8 lb 1.3 oz (3665 g) 7 lb 10.2 oz (3465 g)   Weight today:  8 lbs 14.7 oz   Corrie DandyMary comes today for assistance with breastfeeding, pumping strategies, and how to properly "pace bottle feed".  Mom very tired as she feels all she is doing in feeding, supplementing and pumping.  She desires for baby to just be able to exclusively breastfeed.   [facility], it was determined that baby is latching too shallow, causing her nipple soreness, and limited milk transfer from the breast.  This is causing Corrie DandyMary to have to offer bottle supplement as Chloe is not satisfied from breastfeeding alone.  Baby's weight is great today, with more than adequate weight gain since last appointment.  This is due to increasing the supplement amount.  Assisted with latching baby in football hold.  Baby eager to latch, and her mouth opens and closes rapidly, but not widely.  Breast support is done with hand placement too close to nipple areola.  Mom latches baby without a wide mouth, and baby latched at nipple base.  Took baby off the breast, and assisted with relatching her only with a wide, open gape of mouth.  Hibba felt the latch was much more comfortable.  Nutritive sucks and swallows noted, and taught Corrie DandyMary how to identify these.  Baby fed on both breast, second side Mom  independently latched baby well.  Showed Mom how to tug on baby's chin to help adjust to a deeper latch.  Following a full 25 minutes on the breast, baby was give 30 ml of EBM by bottle.  Pace method demonstrated, and return demonstrated as well.  Baby did very well, Total intake for this feeding was 70 ml.    Breast Feeding Plan:  1- Breastfeed 8-12 times in 24 hrs (wait for wide open mouth before latching) Depth on the breast is most important 2- Shape breast with a U hold back close to chest wall 3- Alternate breast compressions during feedings 4- Offer second breast at each feeding ( 10-20 mins per breast typical ) 5- Offer 30 ml of expressed breast milk by bottle (Pace method) 6- Pump both breast after feeding (at least every other feeding) to increase milk supply 7- If no BF - be sure to pump 20 minutes and give Chloe 2 1/2-3 oz EBM+/ formula by bottle 8- BFSG 8/29 @ 11 am 9- Follow up OP appointment 02/12/16 @ 10:30 am 10- Ask PCP about Dr[facility] Yevette EdwardsJack [facility]    __________________________________________________________________  Mother's Name: Verlin GrillsMary Gound Henckel Type of delivery:  SVD Breastfeeding Experience:  none Maternal Medical Conditions:  uterine fibroids Maternal Medications:  PNV  ________________________________________________________________________  Breastfeeding History (Post Discharge)  Frequency of breastfeeding:  7 times a day Duration of feeding:  20-30 minutes  Supplementation  Formula:  Volume 30-60 ml Frequency:  2 times a day Total volume per day:  60-120 ml       Brand: Similac  Breastmilk:  Volume 60 ml Frequency: 7 X/day Total volume per day:420 ml  Method:  Bottle,   Pumping  Type of pump:  Spectra 2 Frequency:  6 Times a day plus one power pumping a day Volume:  60 ml (4 oz one time power pumping)    Infant Intake and Output Assessment  Voids:  8 in 24 hrs.  Color:  Clear yellow Stools:  8 in 24 hrs.  Color:  Samule Dry and  Yellow  ________________________________________________________________________  Maternal Breast Assessment  Breast:  Soft Nipple:  Erect Pain level:  3 Pain interventions:  Cream/oil coconut oil  _______________________________________________________________________ Feeding Assessment/Evaluation  Initial feeding assessment:  IPositioning:  Football Left breast  LATCH documentation:  Latch:  2 = Grasps breast easily, tongue down, lips flanged, rhythmical sucking.  Audible swallowing:  2 = Spontaneous and intermittent  Type of nipple:  2 = Everted at rest and after stimulation  Comfort (Breast/Nipple):  1 = Filling, red/small blisters or bruises, mild/mod discomfort  Hold (Positioning):  1 = Assistance needed to correctly position infant at breast and maintain latch  LATCH score:  8  Attached assessment:  Deep  Lips flanged:  Yes.    Lips untucked:  Yes.    Suck assessment:  Displays both  Tools:  Pump Instructed on use and cleaning of tool:  Yes.    Pre-feed weight:  4046 g  Post-feed weight:  4068 g Amount transferred:  22 ml  Additional Feeding Assessment -   Positioning:  Football Left breast  LATCH documentation:  Latch:  2 = Grasps breast easily, tongue down, lips flanged, rhythmical sucking.  Audible swallowing:  2 = Spontaneous and intermittent  Type of nipple:  2 = Everted at rest and after stimulation  Comfort (Breast/Nipple):  1= reddened nipples  Hold (Positioning):  2 = No assistance needed to correctly position infant at breast  LATCH score:  9  Attached assessment:  Deep  Lips flanged:  Yes.    Lips untucked:  Yes.    Suck assessment:  Displays both  Pre-feed weight:  4068 g   Post-feed weight:  4086 g Amount transferred:  18 ml    Total amount transferred:  40 ml Total supplement given: 30 ml

## 2016-02-12 ENCOUNTER — Ambulatory Visit (HOSPITAL_COMMUNITY): Admission: RE | Admit: 2016-02-12 | Payer: 59 | Source: Ambulatory Visit

## 2016-04-16 ENCOUNTER — Other Ambulatory Visit: Payer: Self-pay | Admitting: Family Medicine

## 2016-04-16 DIAGNOSIS — R911 Solitary pulmonary nodule: Secondary | ICD-10-CM

## 2016-04-22 ENCOUNTER — Ambulatory Visit
Admission: RE | Admit: 2016-04-22 | Discharge: 2016-04-22 | Disposition: A | Discharge status: Home / Self Care | Payer: 59 | Source: Ambulatory Visit | Attending: Family Medicine | Admitting: Family Medicine

## 2016-04-22 DIAGNOSIS — R911 Solitary pulmonary nodule: Secondary | ICD-10-CM

## 2016-04-26 ENCOUNTER — Telehealth (HOSPITAL_COMMUNITY): Payer: Self-pay | Admitting: Lactation Services

## 2016-04-26 NOTE — Telephone Encounter (Signed)
Corrie DandyMary called concerning how to handle [place] in [place] that she may decide to go on.  She won't be able to pump during the day for at least one of the days.  Her baby is 543 months old, and primarily bottle fed, and Corrie DandyMary is pumping 6 times a day.  Her supply has always been low, and she has had to supplement with formula.  Mom under a lot of stress in her life right now.  Discussed carrying a manual breast pump with her and pumping throughout the day in the bathroom, and discarding her milk if she can't carry a cooler.  Goal would be to relieve her fullness throughout the day.  Corrie DandyMary has tried power pumping daily, but only obtaining 1-2 oz per pumping.  Lots of TLC given over the phone.  Praised Corrie DandyMary to all the work she is doing to enable her baby to get the maximum amount of breast milk she can provide.  Corrie DandyMary uses a Spectra pump.  Suggested she may want to rent a [employer] hospital grade pump as an option.   To call back prn.

## 2019-08-30 ENCOUNTER — Ambulatory Visit: Payer: BLUE CROSS/BLUE SHIELD | Attending: Internal Medicine

## 2019-08-30 DIAGNOSIS — Z23 Encounter for immunization: Secondary | ICD-10-CM

## 2019-08-30 NOTE — Progress Notes (Signed)
   Covid-19 Vaccination Clinic  Name:  Cynthia Sweeney    MRN: [id number] DOB: 06-09-1981  08/30/2019  Cynthia Sweeney was observed post Covid-19 immunization for 15 minutes without incident. She was provided with Vaccine Information Sheet and instruction to access the V-Safe system.   Cynthia Sweeney was instructed to call 911 with any severe reactions post vaccine: Marland Kitchen Difficulty breathing  . Swelling of face and throat  . A fast heartbeat  . A bad rash all over body  . Dizziness and weakness   Immunizations Administered    Name Date Dose VIS Date Route   Pfizer COVID-19 Vaccine 08/30/2019  1:46 PM 0.3 mL 05/25/2019 Intramuscular   Manufacturer: [employer], [employer]   Lot: [id number]   NDC: [address]-0

## 2019-09-26 ENCOUNTER — Ambulatory Visit: Payer: BLUE CROSS/BLUE SHIELD | Attending: Internal Medicine

## 2019-09-26 DIAGNOSIS — Z23 Encounter for immunization: Secondary | ICD-10-CM

## 2019-09-26 NOTE — Progress Notes (Signed)
   Covid-19 Vaccination Clinic  Name:  Cynthia Sweeney    MRN: [id number] DOB: 03/27/1981  09/26/2019  [name] was observed post Covid-19 immunization for 15 minutes without incident. She was provided with Vaccine Information Sheet and instruction to access the V-Safe system.   [name] was instructed to call 911 with any severe reactions post vaccine: Marland Kitchen Difficulty breathing  . Swelling of face and throat  . A fast heartbeat  . A bad rash all over body  . Dizziness and weakness   Immunizations Administered    Name Date Dose VIS Date Route   Pfizer COVID-19 Vaccine 09/26/2019  9:33 AM 0.3 mL 05/25/2019 Intramuscular   Manufacturer: [employer], [employer]   Lot: [id number]   NDC: [address]-9

## 2020-05-09 DIAGNOSIS — R030 Elevated blood-pressure reading, without diagnosis of hypertension: Secondary | ICD-10-CM | POA: Insufficient documentation

## 2020-12-24 ENCOUNTER — Other Ambulatory Visit: Payer: Self-pay | Admitting: Obstetrics and Gynecology

## 2020-12-24 DIAGNOSIS — Z1231 Encounter for screening mammogram for malignant neoplasm of breast: Secondary | ICD-10-CM

## 2021-03-27 ENCOUNTER — Ambulatory Visit: Payer: BLUE CROSS/BLUE SHIELD

## 2021-04-16 ENCOUNTER — Other Ambulatory Visit: Payer: Self-pay

## 2021-04-16 ENCOUNTER — Ambulatory Visit
Admission: RE | Admit: 2021-04-16 | Discharge: 2021-04-16 | Disposition: A | Discharge status: Home / Self Care | Payer: 59 | Source: Ambulatory Visit | Attending: Obstetrics and Gynecology | Admitting: Obstetrics and Gynecology

## 2021-04-16 DIAGNOSIS — Z1231 Encounter for screening mammogram for malignant neoplasm of breast: Secondary | ICD-10-CM

## 2021-06-24 ENCOUNTER — Other Ambulatory Visit: Payer: Self-pay | Admitting: Physical Medicine and Rehabilitation

## 2021-06-24 ENCOUNTER — Other Ambulatory Visit: Payer: Self-pay | Admitting: Obstetrics & Gynecology

## 2021-06-24 DIAGNOSIS — Z1231 Encounter for screening mammogram for malignant neoplasm of breast: Secondary | ICD-10-CM

## 2021-09-22 ENCOUNTER — Encounter (HOSPITAL_BASED_OUTPATIENT_CLINIC_OR_DEPARTMENT_OTHER): Payer: Self-pay | Admitting: *Deleted

## 2021-12-22 IMAGING — MG MM DIGITAL SCREENING BILAT W/ TOMO AND CAD
6 of 12 series · 6 of 36 positions shown · non-contrast
Comparison: Previous exam(s).

CLINICAL DATA: Screening.

EXAM:
DIGITAL SCREENING BILATERAL MAMMOGRAM WITH TOMOSYNTHESIS AND CAD
TECHNIQUE: Bilateral screening digital craniocaudal and mediolateral oblique
mammograms were obtained. Bilateral screening digital breast
tomosynthesis was performed. The images were evaluated with
computer-aided detection.

[R XCCL synth-2D]
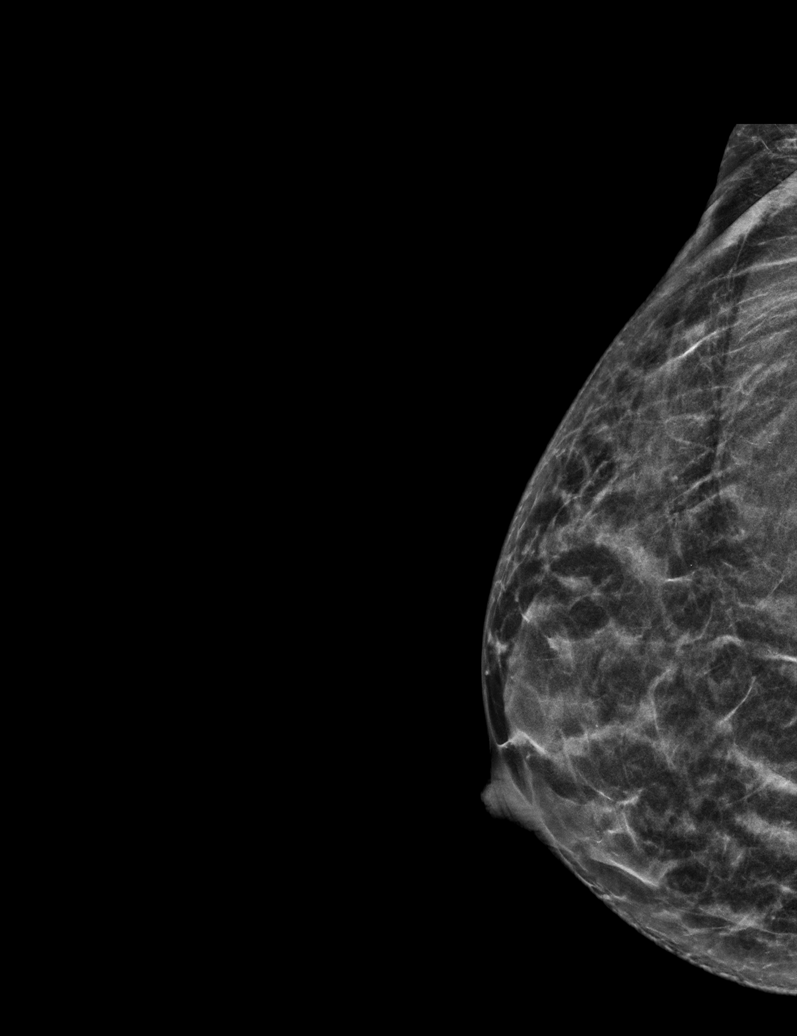

[L CC synth-2D]
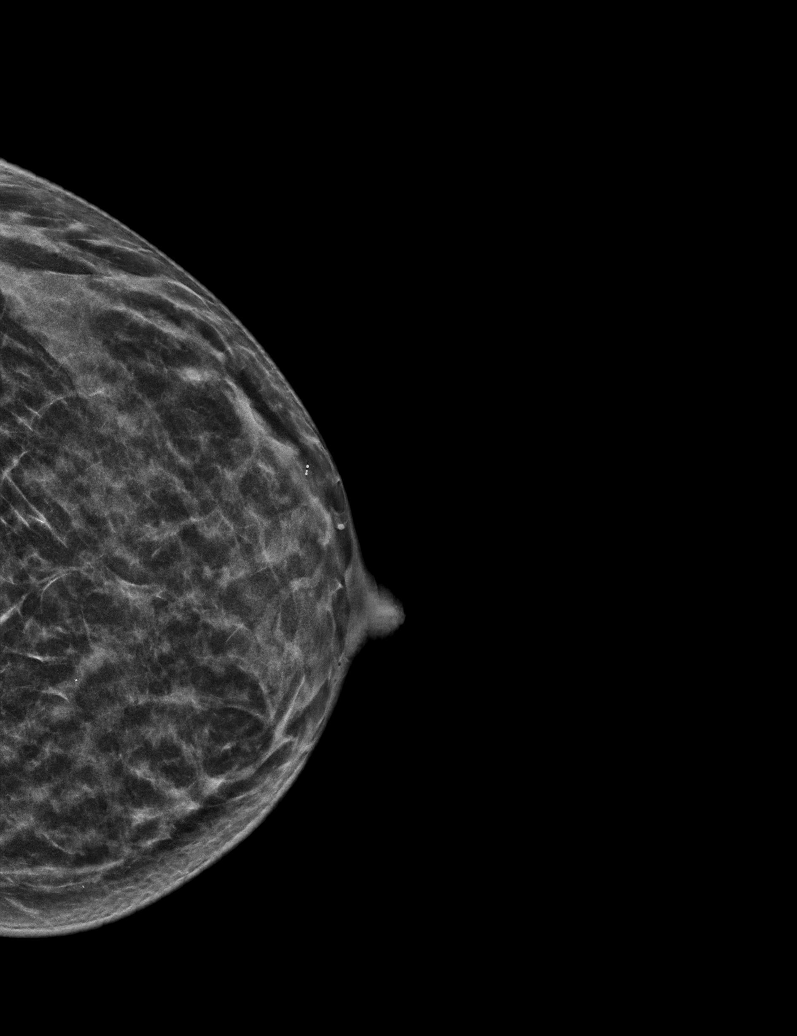

[R CC synth-2D]
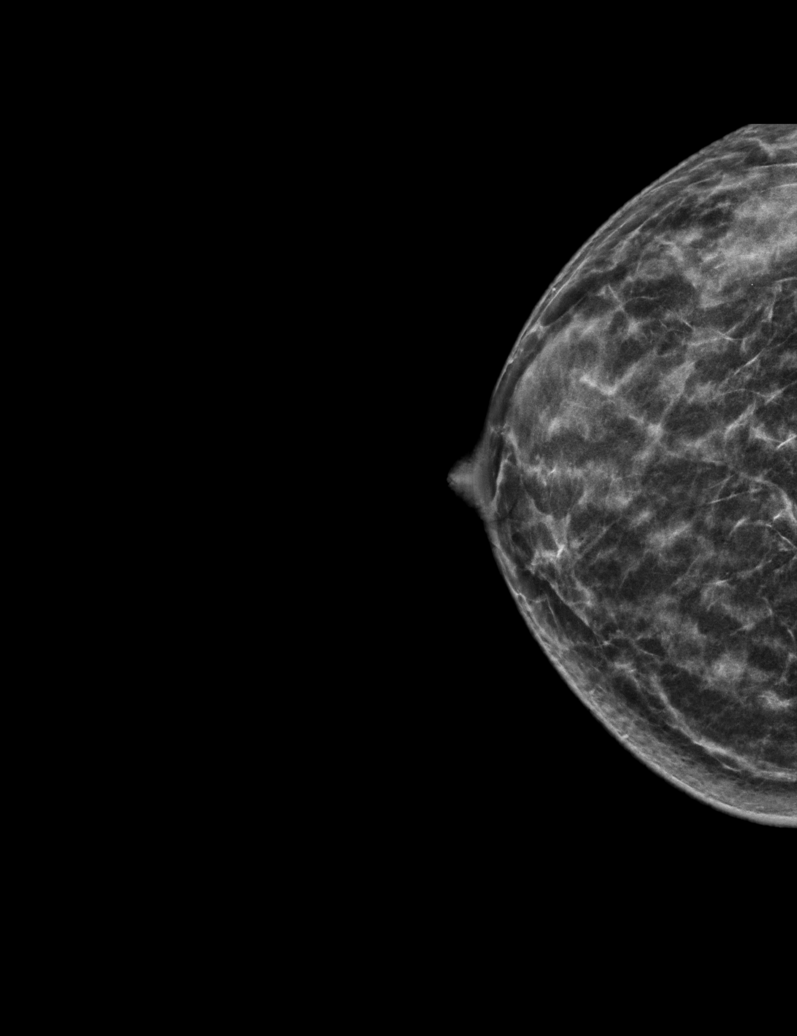

[L XCCL synth-2D]
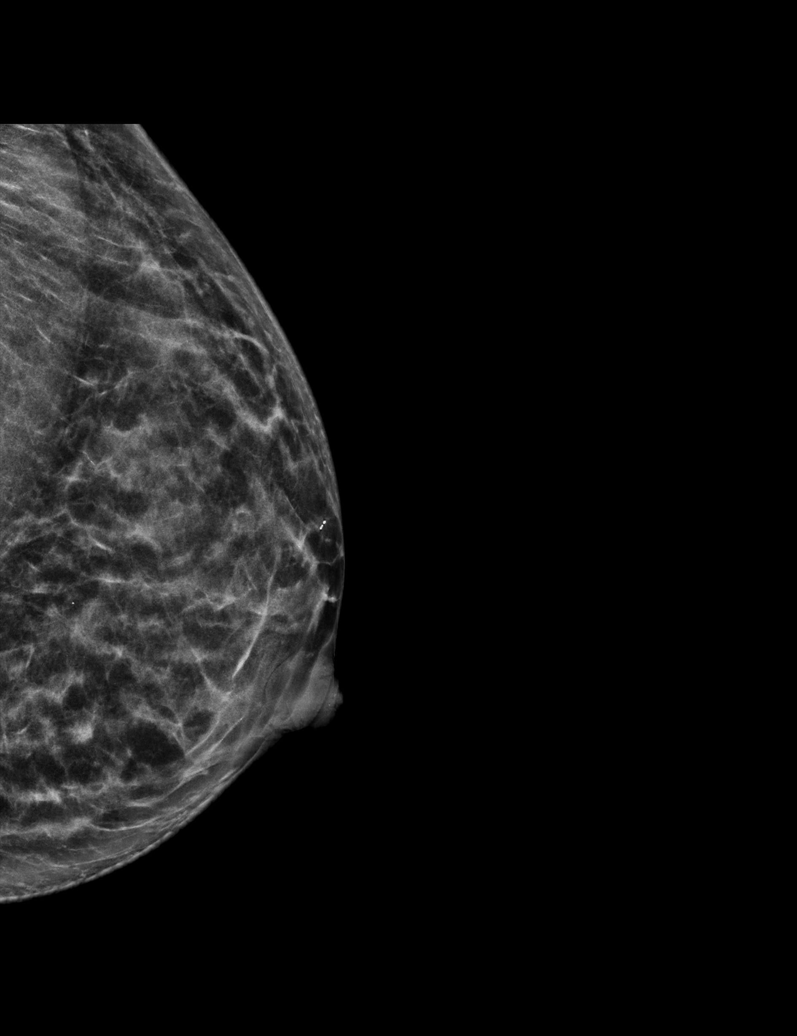

[R MLO synth-2D]
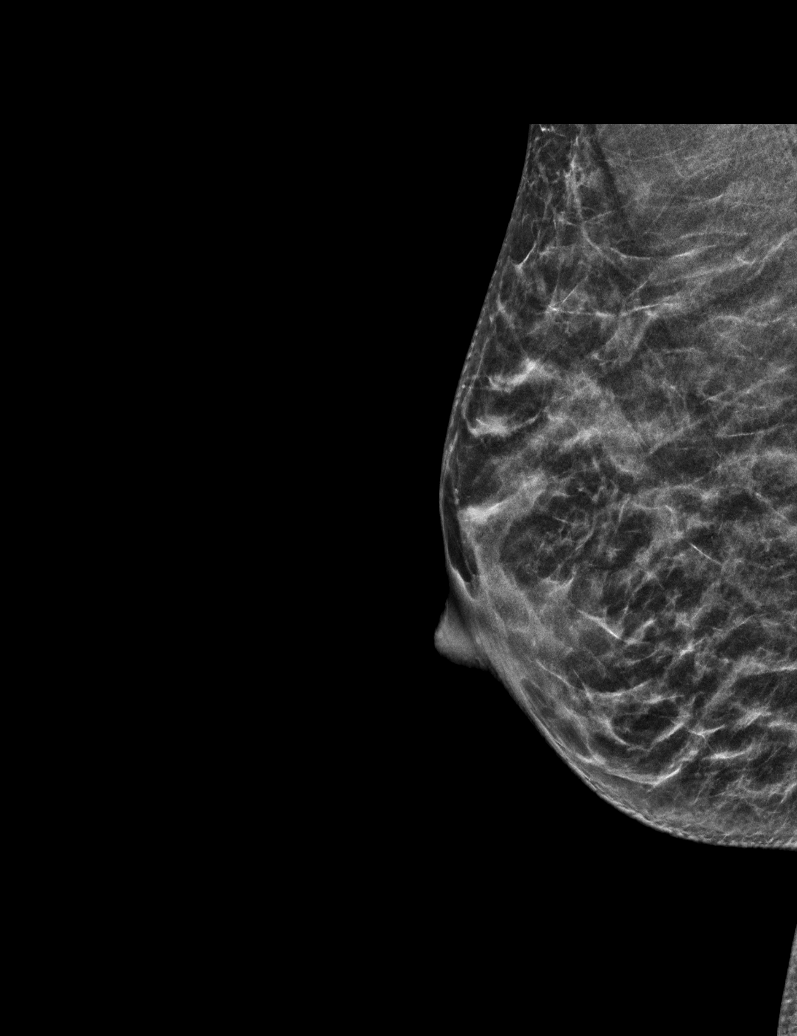

[L MLO synth-2D]
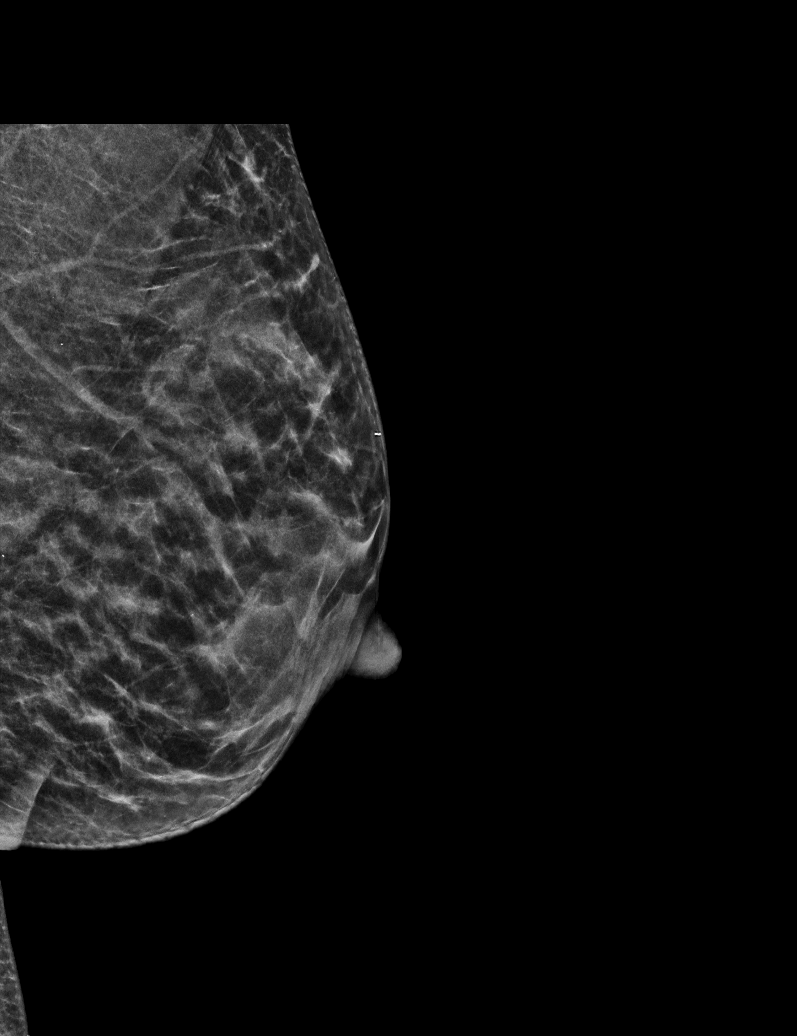

[6 of 36 positions shown; findings below may reference images not displayed]

ACR Breast Density Category c: The breast tissue is heterogeneously
dense, which may obscure small masses.
FINDINGS: There are no findings suspicious for malignancy.
IMPRESSION: No mammographic evidence of malignancy. A result letter of this
screening mammogram will be mailed directly to the patient.

RECOMMENDATION:
Screening mammogram in one year. (Code:Q3-W-BC3)

BI-RADS CATEGORY  1: Negative.

## 2021-12-25 ENCOUNTER — Encounter (HOSPITAL_BASED_OUTPATIENT_CLINIC_OR_DEPARTMENT_OTHER): Payer: Self-pay | Admitting: Obstetrics & Gynecology

## 2021-12-25 ENCOUNTER — Ambulatory Visit (INDEPENDENT_AMBULATORY_CARE_PROVIDER_SITE_OTHER): Payer: 59 | Admitting: Obstetrics & Gynecology

## 2021-12-25 VITALS — BP 167/104 | HR 79 | Ht 68.0 in | Wt 150.6 lb

## 2021-12-25 DIAGNOSIS — I10 Essential (primary) hypertension: Secondary | ICD-10-CM | POA: Diagnosis not present

## 2021-12-25 DIAGNOSIS — Z30014 Encounter for initial prescription of intrauterine contraceptive device: Secondary | ICD-10-CM | POA: Diagnosis not present

## 2021-12-25 DIAGNOSIS — Z01419 Encounter for gynecological examination (general) (routine) without abnormal findings: Secondary | ICD-10-CM

## 2021-12-25 DIAGNOSIS — Z30430 Encounter for insertion of intrauterine contraceptive device: Secondary | ICD-10-CM | POA: Diagnosis not present

## 2021-12-25 DIAGNOSIS — R030 Elevated blood-pressure reading, without diagnosis of hypertension: Secondary | ICD-10-CM | POA: Diagnosis not present

## 2021-12-25 MED ORDER — LEVONORGESTREL 20 MCG/DAY IU IUD
1.0000 | INTRAUTERINE_SYSTEM | Freq: Once | INTRAUTERINE | Status: AC
  Administered 2021-12-25: 1 via INTRAUTERINE

## 2021-12-25 NOTE — Progress Notes (Unsigned)
41 y.o. G82P1001 Married White or Caucasian female here for annual exam/new patient.  Has mother and maternal aunt diagnosed with breast cancer in the past year.  Tyrer Cusick done today with lifetime risk of 28.9%.  Yearly diagnostic breast MRI discussed.  On OCPs.  Cycles are regular.  H/o hypertension and on amlodipine.  Having elevated blood pressure with OCPs.  Recommended she change methods.  Other options for contraception discussed.  Pt would like IUD and would like to have it placed today if possible.  Risks for placement including uterine perforation, irregular bleeding, failure with increased risk for ectopic pregnancy, malpositioned IUD all discussed.  Pt ready to proceed.    Patient's last menstrual period was 11/08/2021.          Sexually active: Yes.    The current method of family planning is OCP (estrogen/progesterone).    Smoker:  no  Health Maintenance: Pap:  12/24/2020 neg with neg HR HPV with Dr. Senaida Ores at [facility] History of abnormal Pap:  no MMG:  04/2021 Colonoscopy:  guidelines reviewed Screening Labs: Dr. Sigmund Hazel   reports that she has never smoked. She has never used smokeless tobacco.  Past Medical History:  Diagnosis Date   Depression    mild   Hx of varicella     Past Surgical History:  Procedure Laterality Date   LAPAROSCOPY     ovarian cyst borderline serous, R tube removed   WISDOM TOOTH EXTRACTION      Current Outpatient Medications  Medication Sig Dispense Refill   amLODipine (NORVASC) 2.5 MG tablet Take 2.5 mg by mouth daily.     azelaic acid (AZELEX) 20 % cream Apply topically 2 (two) times daily. After skin is thoroughly washed and patted dry, gently but thoroughly massage a thin film of azelaic acid cream into the affected area twice daily, in the morning and evening.     ibuprofen (ADVIL,MOTRIN) 600 MG tablet Take 1 tablet (600 mg total) by mouth every 6 (six) hours as needed for fever, headache, mild pain, moderate pain or  cramping. 30 tablet 1   Multiple Vitamin (MULTIVITAMIN) capsule Take 1 capsule by mouth daily.     norethindrone-ethinyl estradiol-FE (LOESTRIN FE) 1-20 MG-MCG tablet Take 1 tablet by mouth daily.     No current facility-administered medications for this visit.    Family History  Problem Relation Age of Onset   Breast cancer Mother    Breast cancer Other     Review of Systems  All other systems reviewed and are negative.  Exam:   BP (!) 167/104 (BP Location: Left Arm, Patient Position: Sitting, Cuff Size: Normal)   Pulse 79   Ht 5\' 8"  (1.727 m) Comment: reported  Wt 150 lb 9.6 oz (68.3 kg)   LMP 11/08/2021   BMI 22.90 kg/m   Height: 5\' 8"  (172.7 cm) (reported)  General appearance: alert, cooperative and appears stated age Head: Normocephalic, without obvious abnormality, atraumatic Neck: no adenopathy, supple, symmetrical, trachea midline and thyroid normal to inspection and palpation Lungs: clear to auscultation bilaterally Breasts: normal appearance, no masses or tenderness Heart: regular rate and rhythm Abdomen: soft, non-tender; bowel sounds normal; no masses,  no organomegaly Extremities: extremities normal, atraumatic, no cyanosis or edema Skin: Skin color, texture, turgor normal. No rashes or lesions Lymph nodes: Cervical, supraclavicular, and axillary nodes normal. No abnormal inguinal nodes palpated Neurologic: Grossly normal   Pelvic: External genitalia:  no lesions  Urethra:  normal appearing urethra with no masses, tenderness or lesions              Bartholins and Skenes: normal                 Vagina: normal appearing vagina with normal color and no discharge, no lesions              Cervix: no lesions              Pap taken: No. Bimanual Exam:  Uterus:  normal size, contour, position, consistency, mobility, non-tender              Adnexa: normal adnexa and no mass, fullness, tenderness               Rectovaginal: Confirms               Anus:   normal sphincter tone, no lesions  Procedure:  Consent obtained.  Speculum reinserted.  Cervix visualized and cleansed with Betadine x 3.  Uterus sounded to 9cm.  IUD package was opened.  IUD and introducer passed to fundus and then withdrawn slightly before IUD was passed into endometrial cavity.  Introducer removed.  Strings cut to 2cm.  Tenaculum removed from cervix.  Minimal bleeding noted.  Pt tolerated the procedure well.  All instruments removed from vagina.   Chaperone, Ina Homes, CMA, was present for exam.  Assessment/Plan: 1. Well woman exam with routine gynecological exam - pap 12/24/2020 neg with neg HR HPV.  This is in Epic.  Not indicated today. - MMG 04/2021 - colonoscopy guidelines reviewed - BMD not indicated - vaccines reviewed/updated - lab work done with Sigmund Hazel, MD, PCP  2. Encounter for insertion of mirena IUD - levonorgestrel (MIRENA) 20 MCG/DAY IUD 1 each - Return for recheck 6-8 weeks - Pt aware to call for any concerns - Pt aware removal due no later than 12/25/2029.  IUD card given to pt.  3. Encounter for initial prescription of intrauterine contraceptive device (IUD)  4. Elevated blood pressure reading - pt will monitor.  Hopefully this will improve with stopping the estrogen containing contraception.  May need to have follow up with PCP.  Will recheck BP when she comes for IUD recheck  5. Primary hypertension

## 2021-12-28 DIAGNOSIS — I10 Essential (primary) hypertension: Secondary | ICD-10-CM | POA: Insufficient documentation

## 2021-12-28 DIAGNOSIS — Z30430 Encounter for insertion of intrauterine contraceptive device: Secondary | ICD-10-CM | POA: Insufficient documentation

## 2022-03-03 ENCOUNTER — Encounter (HOSPITAL_BASED_OUTPATIENT_CLINIC_OR_DEPARTMENT_OTHER): Payer: Self-pay | Admitting: Obstetrics & Gynecology

## 2022-03-03 ENCOUNTER — Ambulatory Visit (HOSPITAL_BASED_OUTPATIENT_CLINIC_OR_DEPARTMENT_OTHER): Payer: 59 | Admitting: Obstetrics & Gynecology

## 2022-03-03 VITALS — BP 158/89 | HR 66 | Ht 68.0 in | Wt 149.4 lb

## 2022-03-03 DIAGNOSIS — N939 Abnormal uterine and vaginal bleeding, unspecified: Secondary | ICD-10-CM | POA: Diagnosis not present

## 2022-03-03 DIAGNOSIS — Z975 Presence of (intrauterine) contraceptive device: Secondary | ICD-10-CM

## 2022-03-03 DIAGNOSIS — Z30431 Encounter for routine checking of intrauterine contraceptive device: Secondary | ICD-10-CM | POA: Diagnosis not present

## 2022-03-03 NOTE — Progress Notes (Signed)
41 y.o. G88P1001 Married Caucasian female presents for followed up after insertion of Mirena IUD on 7/14.  Pt reports she is doing well except spotting almost daily.  None yesterday.  Denies pain with intercourse.  Isn't sure if this is normal and what to do if continues.  Has had what felt like tow menstrual cycles.  These were much lighter than typical cycles so that was a good change.  LMP:  No LMP recorded.  Patient Active Problem List   Diagnosis Date Noted   IUD (intrauterine device) in place 03/03/2022   Hypertension 12/28/2021   Elevated blood pressure reading 05/09/2020   Past Medical History:  Diagnosis Date   Depression    mild   Hx of varicella    Current Outpatient Medications on File Prior to Visit  Medication Sig Dispense Refill   amLODipine (NORVASC) 2.5 MG tablet Take 2.5 mg by mouth daily.     azelaic acid (AZELEX) 20 % cream Apply topically 2 (two) times daily. After skin is thoroughly washed and patted dry, gently but thoroughly massage a thin film of azelaic acid cream into the affected area twice daily, in the morning and evening.     Multiple Vitamin (MULTIVITAMIN) capsule Take 1 capsule by mouth daily.     No current facility-administered medications on file prior to visit.   Valtrex [valacyclovir]  Review of Systems  Constitutional: Negative.   Genitourinary:        Vaginal spotting   Vitals:   03/03/22 0815  BP: (!) 158/89  Pulse: 66  Weight: 149 lb 6.4 oz (67.8 kg)  Height: 5\' 8"  (1.727 m)    Gen:  WNWF healthy female NAD Abdomen: soft, non-tender Groin:  no inguinal nodes palpated  Pelvic exam: Vulva:  normal female genitalia Vagina:  normal vagina Cervix:  Non-tender, Negative CMT, no lesions or redness.  IUD string noted and 2cm.   Uterus:  normal shape, position and consistency    Assessment/Plan: 1. IUD check up - IUD string 2cm and no change  2. Vaginal spotting - discussed with pt typical bleeding patterns.  Would recommend TVUS  if still having spotting after another two months to check for placement and other possible causes of bleeding. She will let me know if this continues.

## 2022-04-19 ENCOUNTER — Ambulatory Visit
Admission: RE | Admit: 2022-04-19 | Discharge: 2022-04-19 | Disposition: A | Discharge status: Home / Self Care | Payer: 59 | Source: Ambulatory Visit | Attending: Obstetrics & Gynecology | Admitting: Obstetrics & Gynecology

## 2022-04-19 DIAGNOSIS — Z1231 Encounter for screening mammogram for malignant neoplasm of breast: Secondary | ICD-10-CM | POA: Diagnosis not present

## 2022-04-21 ENCOUNTER — Other Ambulatory Visit: Payer: Self-pay | Admitting: Family Medicine

## 2022-04-21 DIAGNOSIS — Z1231 Encounter for screening mammogram for malignant neoplasm of breast: Secondary | ICD-10-CM

## 2022-05-10 DIAGNOSIS — Z79899 Other long term (current) drug therapy: Secondary | ICD-10-CM | POA: Diagnosis not present

## 2022-05-10 DIAGNOSIS — I10 Essential (primary) hypertension: Secondary | ICD-10-CM | POA: Diagnosis not present

## 2022-06-02 ENCOUNTER — Other Ambulatory Visit (HOSPITAL_BASED_OUTPATIENT_CLINIC_OR_DEPARTMENT_OTHER): Payer: Self-pay | Admitting: Obstetrics & Gynecology

## 2022-06-02 ENCOUNTER — Other Ambulatory Visit (INDEPENDENT_AMBULATORY_CARE_PROVIDER_SITE_OTHER): Payer: 59

## 2022-06-02 ENCOUNTER — Ambulatory Visit (HOSPITAL_BASED_OUTPATIENT_CLINIC_OR_DEPARTMENT_OTHER): Payer: 59 | Admitting: Obstetrics & Gynecology

## 2022-06-02 DIAGNOSIS — N939 Abnormal uterine and vaginal bleeding, unspecified: Secondary | ICD-10-CM

## 2022-06-02 DIAGNOSIS — Z975 Presence of (intrauterine) contraceptive device: Secondary | ICD-10-CM

## 2022-06-02 MED ORDER — NORETHINDRONE 0.35 MG PO TABS: 1.0000 | ORAL_TABLET | Freq: Every day | ORAL | 1 refills | Status: DC

## 2022-06-02 NOTE — Progress Notes (Signed)
GYNECOLOGY  VISIT  CC:   follow up after ultrasound due to vaginal spotting  HPI: 41 y.o. G2P1001 Married White or Caucasian female here for ultrasound to check IUD placement due to irregular vaginal spotting.  Pt doesn't go a week without some spotting.  She really did want an IUD so has been frustrated with this.  Denies pain.  Due to irregular bleeding, ultrasound ordered.  Uterus normal.  Ovaires normal.  IUD in correct location.  Discussed with pt either having IUD removed and going on another contraceptive method or just going on micronor to see if this helps with symptoms over next 2-3 months.  If it did, could just stop the micronor at that time and see if spotting returned.  Pt discussed with spouse and decided to start micronor for a few months to see what would happen.  Risks, benefits, side effects reviewed.     Past Medical History:  Diagnosis Date   Depression    mild   Hx of varicella     MEDS:   Current Outpatient Medications on File Prior to Visit  Medication Sig Dispense Refill   amLODipine (NORVASC) 2.5 MG tablet Take 2.5 mg by mouth daily.     azelaic acid (AZELEX) 20 % cream Apply topically 2 (two) times daily. After skin is thoroughly washed and patted dry, gently but thoroughly massage a thin film of azelaic acid cream into the affected area twice daily, in the morning and evening.     Multiple Vitamin (MULTIVITAMIN) capsule Take 1 capsule by mouth daily.     No current facility-administered medications on file prior to visit.    ALLERGIES: Valtrex [valacyclovir]  SH:  married, non smoker  Review of Systems  Constitutional: Negative.   Genitourinary:        Irregular spotting    PHYSICAL EXAMINATION:    There were no vitals taken for this visit.    General appearance: alert, cooperative and appears stated age  Assessment/Plan: 1. Vaginal spotting - for now, pt will start micronor and see how she does over the next 1-2 months.  Will give update at that  time.  Pt aware she can have IUD removed at anytime she likes going forward.   - norethindrone (MICRONOR) 0.35 MG tablet; Take 1 tablet (0.35 mg total) by mouth daily.  Dispense: 84 tablet; Refill: 1

## 2022-06-08 ENCOUNTER — Encounter (HOSPITAL_BASED_OUTPATIENT_CLINIC_OR_DEPARTMENT_OTHER): Payer: Self-pay | Admitting: Obstetrics & Gynecology

## 2022-06-08 DIAGNOSIS — N939 Abnormal uterine and vaginal bleeding, unspecified: Secondary | ICD-10-CM | POA: Insufficient documentation

## 2022-10-28 ENCOUNTER — Other Ambulatory Visit (HOSPITAL_BASED_OUTPATIENT_CLINIC_OR_DEPARTMENT_OTHER): Payer: Self-pay | Admitting: Obstetrics & Gynecology

## 2022-10-28 DIAGNOSIS — N939 Abnormal uterine and vaginal bleeding, unspecified: Secondary | ICD-10-CM

## 2022-10-28 MED ORDER — NORETHINDRONE 0.35 MG PO TABS: 1.0000 | ORAL_TABLET | Freq: Every day | ORAL | 1 refills | Status: AC

## 2022-12-23 ENCOUNTER — Encounter (HOSPITAL_BASED_OUTPATIENT_CLINIC_OR_DEPARTMENT_OTHER): Payer: Self-pay | Admitting: Obstetrics & Gynecology

## 2023-04-21 ENCOUNTER — Ambulatory Visit: Payer: BLUE CROSS/BLUE SHIELD
